# Patient Record
Sex: Female | Born: 1960 | Race: Black or African American | Hispanic: No | Marital: Single | Smoking: Never smoker
Health system: Southern US, Community
[De-identification: ages and names within clinical notes are randomized; demographics above are authoritative.]

## PROBLEM LIST (undated history)

## (undated) DIAGNOSIS — I10 Essential (primary) hypertension: Secondary | ICD-10-CM

## (undated) HISTORY — DX: Essential (primary) hypertension: I10

---

## 1997-08-18 ENCOUNTER — Emergency Department (HOSPITAL_COMMUNITY): Admission: EM | Admit: 1997-08-18 | Discharge: 1997-08-18 | Payer: Self-pay | Admitting: Emergency Medicine

## 1998-10-05 ENCOUNTER — Other Ambulatory Visit: Admission: RE | Admit: 1998-10-05 | Discharge: 1998-10-05 | Payer: Self-pay | Admitting: Obstetrics and Gynecology

## 2000-08-20 ENCOUNTER — Ambulatory Visit (HOSPITAL_BASED_OUTPATIENT_CLINIC_OR_DEPARTMENT_OTHER): Admission: RE | Admit: 2000-08-20 | Discharge: 2000-08-20 | Payer: Self-pay | Admitting: Surgery

## 2009-08-30 ENCOUNTER — Emergency Department (HOSPITAL_COMMUNITY): Admission: EM | Admit: 2009-08-30 | Discharge: 2009-08-31 | Payer: Self-pay | Admitting: Emergency Medicine

## 2010-06-14 NOTE — Op Note (Signed)
[place]. [facility]  Patient:    AMORITA, VANROSSUM                       MRN: [id number] Proc. Date: 08/20/00 Attending:  Abigail Miyamoto, M.D.                           Operative Report  PREOPERATIVE DIAGNOSIS:  Right flank mass.  POSTOPERATIVE DIAGNOSIS:  Right flank mass.  PROCEDURE:  Excision of right flank mass measuring 6 x 8 cm.  SURGEON:  Abigail Miyamoto, M.D.  ANESTHESIA:  1% lidocaine with monitored anesthesia care.  DESCRIPTION OF PROCEDURE:  The patient was brought to the operating room, identified as Rosanna Randy.  She was placed upon the operating table, and anesthesia was induced.  Next, the patient was placed in the left lateral decubitus position.  The skin overlying the large, palpable mass was anesthetized with 1% lidocaine.  The incision was carried down through the skin with a scalpel.  The flank fascia was then opened and the large mass consisting of a lipoma was identified.  It was completely dissected free circumferentially with the electrocautery.  The mass was removed in its entirety.  The wound was then thoroughly irrigated with normal saline.  The subcutaneous layer was then closed with interrupted 2-0 Vicryl, skin closed with a running 4-0 Vicryl suture, Steri-Strips, gauze, and tape were then applied.  The patient tolerated the procedure well.  All sponge, needle, and instrument counts were correct at the end of the procedure.  The patient was taken in stable condition from the operating room to the recovery room. DD:  08/20/00 TD:  08/20/00 Job: [id number]
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
## 2019-03-01 ENCOUNTER — Ambulatory Visit (HOSPITAL_COMMUNITY)
Admission: EM | Admit: 2019-03-01 | Discharge: 2019-03-01 | Disposition: A | Discharge status: Home / Self Care | Payer: Medicaid Other | Attending: Family Medicine | Admitting: Family Medicine

## 2019-03-01 ENCOUNTER — Other Ambulatory Visit: Payer: Self-pay

## 2019-03-01 ENCOUNTER — Encounter (HOSPITAL_COMMUNITY): Payer: Self-pay

## 2019-03-01 DIAGNOSIS — R030 Elevated blood-pressure reading, without diagnosis of hypertension: Secondary | ICD-10-CM | POA: Diagnosis not present

## 2019-03-01 DIAGNOSIS — K529 Noninfective gastroenteritis and colitis, unspecified: Secondary | ICD-10-CM | POA: Diagnosis present

## 2019-03-01 DIAGNOSIS — Z20822 Contact with and (suspected) exposure to covid-19: Secondary | ICD-10-CM | POA: Diagnosis present

## 2019-03-01 MED ORDER — ONDANSETRON 4 MG PO TBDP
4.0000 mg | ORAL_TABLET | Freq: Once | ORAL | Status: AC
  Administered 2019-03-01: 4 mg via ORAL

## 2019-03-01 MED ORDER — LOPERAMIDE HCL 2 MG PO CAPS: 2.0000 mg | ORAL_CAPSULE | Freq: Four times a day (QID) | ORAL | 0 refills | Status: DC | PRN

## 2019-03-01 MED ORDER — ONDANSETRON HCL 4 MG PO TABS: 4.0000 mg | ORAL_TABLET | Freq: Three times a day (TID) | ORAL | 0 refills | Status: AC | PRN

## 2019-03-01 MED ORDER — ONDANSETRON 4 MG PO TBDP
ORAL_TABLET | ORAL | Status: AC
  Filled 2019-03-01: qty 1

## 2019-03-01 NOTE — Discharge Instructions (Addendum)
Take the zofran every 8 hours  Begin the imodium if you continue to have loose stools tonight  Drink plenty of water and include gatorade.  Eat small bland meals of hearty foods  Establish with a primary care provider to have your blood pressure rechecked in 2 weeks   Go directly to the Emergency Department or call 911 if you have severe chest pain, shortness of breath, severe diarrhea or feel as though you might pass out.   If your Covid-19 test is positive, you will receive a phone call from [facility] regarding your results. Negative test results are not called. Both positive and negative results area always visible on MyChart. If you do not have a MyChart account, sign up instructions are in your discharge papers.   Persons who are directed to care for themselves at home may discontinue isolation under the following conditions:   At least 10 days have passed since symptom onset and  At least 24 hours have passed without running a fever (this means without the use of fever-reducing medications) and  Other symptoms have improved.  Persons infected with COVID-19 who never develop symptoms may discontinue isolation and other precautions 10 days after the date of their first positive COVID-19 test.

## 2019-03-01 NOTE — ED Provider Notes (Signed)
Kylie Macdonald    CSN: [id number] Arrival date & time: 03/01/19  [place]      History   Chief Complaint Chief Complaint  Patient presents with  . Nausea  . Emesis    HPI Kylie Macdonald is a 59 y.o. female.   Patient reports to urgent care today for 1 day history of nausea, vomiting and diarrhea. She reports 4 episodes of vomiting after consuming food yesterday. She states she was able to keep some fluids down. The vomiting only occurred after eating and she denies dry heaving. She denies vomiting today but continues to endorse some nausea. Denies blood in vomit. She reports around 4 episodes of liquid brown diarrhea yesterday as well. She has had 3 episodes today. She denies blood in stool. Denies fever, chills, cough, shortness of breath, congestion, headache, sore throat. Denies painful urination, frequency and urgency.  She reports her daughter had similar symptoms last week. She has not taken medications for her symptoms and believes she is generally doing better.      History reviewed. No pertinent past medical history.  There are no problems to display for this patient.   History reviewed. No pertinent surgical history.  OB History   No obstetric history on file.      Home Medications    Prior to Admission medications   Medication Sig Start Date End Date Taking? Authorizing Provider  loperamide (IMODIUM) 2 MG capsule Take 1 capsule (2 mg total) by mouth 4 (four) times daily as needed for diarrhea or loose stools. 03/01/19   Muaad Boehning, Marguerita Beards, PA-C  ondansetron (ZOFRAN) 4 MG tablet Take 1 tablet (4 mg total) by mouth every 8 (eight) hours as needed for up to 3 days for nausea or vomiting. 03/01/19 03/04/19  Shanda Cadotte, Marguerita Beards, PA-C    Family History Family History  Family history unknown: Yes    Social History Social History   Tobacco Use  . Smoking status: Never Smoker  . Smokeless tobacco: Never Used  Substance Use Topics  . Alcohol use: Never  . Drug use:  Never     Allergies   Patient has no known allergies.   Review of Systems Review of Systems  Constitutional: Negative for chills, fatigue and fever.  HENT: Negative for congestion, ear pain, postnasal drip, rhinorrhea, sinus pressure, sinus pain, sneezing and sore throat.   Eyes: Negative for pain and visual disturbance.  Respiratory: Negative for cough and shortness of breath.   Cardiovascular: Negative for chest pain and palpitations.  Gastrointestinal: Positive for diarrhea, nausea and vomiting. Negative for abdominal pain.  Genitourinary: Negative for dysuria, frequency, hematuria and urgency.  Musculoskeletal: Negative for arthralgias and back pain.  Skin: Negative for color change and rash.  Neurological: Negative for seizures, syncope and headaches.  All other systems reviewed and are negative.    Physical Exam Triage Vital Signs ED Triage Vitals  Enc Vitals Group     BP 03/01/19 1846 (!) 156/103     Pulse Rate 03/01/19 1846 93     Resp 03/01/19 1846 18     Temp 03/01/19 1846 98.8 F (37.1 C)     Temp Source 03/01/19 1846 Oral     SpO2 03/01/19 1846 100 %     Weight --      Height --      Head Circumference --      Peak Flow --      Pain Score 03/01/19 1843 0     Pain Loc --  Pain Edu? --      Excl. in GC? --    No data found.  Updated Vital Signs BP (!) 156/103 (BP Location: Left Arm)   Pulse 93   Temp 98.8 F (37.1 C) (Oral)   Resp 18   SpO2 100%   Visual Acuity Right Eye Distance:   Left Eye Distance:   Bilateral Distance:    Right Eye Near:   Left Eye Near:    Bilateral Near:     Physical Exam Vitals and nursing note reviewed.  Constitutional:      General: She is not in acute distress.    Appearance: She is well-developed. She is not ill-appearing.  HENT:     Head: Normocephalic and atraumatic.     Nose: Nose normal. No congestion.     Mouth/Throat:     Mouth: Mucous membranes are moist.     Pharynx: Oropharynx is clear.  Eyes:      General: No scleral icterus.    Conjunctiva/sclera: Conjunctivae normal.     Pupils: Pupils are equal, round, and reactive to light.  Cardiovascular:     Rate and Rhythm: Normal rate and regular rhythm.     Heart sounds: No murmur.  Pulmonary:     Effort: Pulmonary effort is normal. No respiratory distress.     Breath sounds: Normal breath sounds. No wheezing, rhonchi or rales.  Abdominal:     General: Bowel sounds are normal.     Palpations: Abdomen is soft.     Tenderness: There is no abdominal tenderness. There is no right CVA tenderness or left CVA tenderness.  Musculoskeletal:     Cervical back: Neck supple.     Right lower leg: No edema.     Left lower leg: No edema.  Skin:    General: Skin is warm and dry.     Coloration: Skin is not jaundiced.     Findings: No rash.  Neurological:     General: No focal deficit present.     Mental Status: She is alert and oriented to person, place, and time.  Psychiatric:        Mood and Affect: Mood normal.        Behavior: Behavior normal.        Thought Content: Thought content normal.        Judgment: Judgment normal.      UC Treatments / Results  Labs (all labs ordered are listed, but only abnormal results are displayed) Labs Reviewed  NOVEL CORONAVIRUS, NAA (HOSP ORDER, SEND-OUT TO REF LAB; TAT 18-24 HRS)    EKG   Radiology No results found.  Procedures Procedures (including critical care time)  Medications Ordered in UC Medications  ondansetron (ZOFRAN-ODT) disintegrating tablet 4 mg (4 mg Oral Given 03/01/19 1856)    Initial Impression / Assessment and Plan / UC Course  I have reviewed the triage vital signs and the nursing notes.  Pertinent labs & imaging results that were available during my care of the patient were reviewed by me and considered in my medical decision making (see chart for details).     #Gastroenteritis - believe to be viral. Improving symptoms. Zofran in clinic. Abdominal exam benign.   - COVID PCR sent - Encouraged PO intake, zofran sent for nausea - Discussed follow up for BP, PCP information discussed - ED and return precautions discussed  Final Clinical Impressions(s) / UC Diagnoses   Final diagnoses:  Gastroenteritis  Encounter for laboratory testing for COVID-19 virus  Elevated blood pressure reading without diagnosis of hypertension     Discharge Instructions     Take the zofran every 8 hours  Begin the imodium if you continue to have loose stools tonight  Drink plenty of water and include gatorade.  Eat small bland meals of hearty foods  Establish with a primary care provider to have your blood pressure rechecked in 2 weeks   Go directly to the Emergency Department or call 911 if you have severe chest pain, shortness of breath, severe diarrhea or feel as though you might pass out.   If your Covid-19 test is positive, you will receive a phone call from [facility] regarding your results. Negative test results are not called. Both positive and negative results area always visible on MyChart. If you do not have a MyChart account, sign up instructions are in your discharge papers.   Persons who are directed to care for themselves at home may discontinue isolation under the following conditions:  . At least 10 days have passed since symptom onset and . At least 24 hours have passed without running a fever (this means without the use of fever-reducing medications) and . Other symptoms have improved.  Persons infected with COVID-19 who never develop symptoms may discontinue isolation and other precautions 10 days after the date of their first positive COVID-19 test.      ED Prescriptions    Medication Sig Dispense Auth. Provider   ondansetron (ZOFRAN) 4 MG tablet Take 1 tablet (4 mg total) by mouth every 8 (eight) hours as needed for up to 3 days for nausea or vomiting. 9 tablet Phillippe Orlick, Veryl Speak, PA-C   loperamide (IMODIUM) 2 MG capsule Take 1 capsule  (2 mg total) by mouth 4 (four) times daily as needed for diarrhea or loose stools. 12 capsule Adylin Hankey, Veryl Speak, PA-C     PDMP not reviewed this encounter.   Hermelinda Medicus, PA-C 03/01/19 2259

## 2019-03-01 NOTE — ED Triage Notes (Addendum)
Pt c/o n/v/d since last night, states sx improved this evening.  Last emesis this afternoon. States her daughter "had a stomach bug last week."   Denies abd pain, dysuria sx, SOB, HA, fever, chills, sore throat, HA.

## 2019-03-03 LAB — NOVEL CORONAVIRUS, NAA (HOSP ORDER, SEND-OUT TO REF LAB; TAT 18-24 HRS): SARS-CoV-2, NAA: NOT DETECTED

## 2019-06-16 DIAGNOSIS — Z23 Encounter for immunization: Secondary | ICD-10-CM | POA: Diagnosis not present

## 2019-06-30 DIAGNOSIS — E669 Obesity, unspecified: Secondary | ICD-10-CM | POA: Diagnosis not present

## 2019-06-30 DIAGNOSIS — I10 Essential (primary) hypertension: Secondary | ICD-10-CM | POA: Diagnosis not present

## 2019-07-07 DIAGNOSIS — Z23 Encounter for immunization: Secondary | ICD-10-CM | POA: Diagnosis not present

## 2020-01-10 DIAGNOSIS — E785 Hyperlipidemia, unspecified: Secondary | ICD-10-CM | POA: Diagnosis not present

## 2020-01-10 DIAGNOSIS — Z7189 Other specified counseling: Secondary | ICD-10-CM | POA: Diagnosis not present

## 2020-01-10 DIAGNOSIS — I10 Essential (primary) hypertension: Secondary | ICD-10-CM | POA: Diagnosis not present

## 2020-01-17 DIAGNOSIS — E6609 Other obesity due to excess calories: Secondary | ICD-10-CM | POA: Diagnosis not present

## 2020-01-17 DIAGNOSIS — I10 Essential (primary) hypertension: Secondary | ICD-10-CM | POA: Diagnosis not present

## 2020-01-17 DIAGNOSIS — Z7189 Other specified counseling: Secondary | ICD-10-CM | POA: Diagnosis not present

## 2020-02-07 DIAGNOSIS — I10 Essential (primary) hypertension: Secondary | ICD-10-CM | POA: Diagnosis not present

## 2020-02-07 DIAGNOSIS — Z7189 Other specified counseling: Secondary | ICD-10-CM | POA: Diagnosis not present

## 2020-02-07 DIAGNOSIS — R000 Tachycardia, unspecified: Secondary | ICD-10-CM | POA: Diagnosis not present

## 2020-03-20 ENCOUNTER — Ambulatory Visit: Payer: Self-pay | Admitting: Cardiology

## 2020-04-03 ENCOUNTER — Ambulatory Visit: Payer: Self-pay | Admitting: Cardiology

## 2020-05-01 ENCOUNTER — Encounter: Payer: Self-pay | Admitting: Cardiology

## 2020-05-01 ENCOUNTER — Other Ambulatory Visit: Payer: Self-pay

## 2020-05-01 ENCOUNTER — Ambulatory Visit: Payer: Medicaid Other | Admitting: Cardiology

## 2020-05-01 ENCOUNTER — Inpatient Hospital Stay: Payer: Self-pay

## 2020-05-01 VITALS — BP 126/84 | HR 86 | Temp 98.1°F | Resp 16 | Ht 63.0 in | Wt 203.0 lb

## 2020-05-01 DIAGNOSIS — R002 Palpitations: Secondary | ICD-10-CM

## 2020-05-01 DIAGNOSIS — R000 Tachycardia, unspecified: Secondary | ICD-10-CM | POA: Diagnosis not present

## 2020-05-01 DIAGNOSIS — E6609 Other obesity due to excess calories: Secondary | ICD-10-CM | POA: Diagnosis not present

## 2020-05-01 DIAGNOSIS — I10 Essential (primary) hypertension: Secondary | ICD-10-CM | POA: Diagnosis not present

## 2020-05-01 DIAGNOSIS — Z6835 Body mass index (BMI) 35.0-35.9, adult: Secondary | ICD-10-CM

## 2020-05-01 DIAGNOSIS — E66812 Obesity, class 2: Secondary | ICD-10-CM

## 2020-05-01 NOTE — Progress Notes (Signed)
Date:  05/01/2020   ID:  Kylie Macdonald, DOB 25-Feb-1960, MRN [id number]  PCP:  Patient, No Pcp Per (Inactive)  Cardiologist:  Rex Kras, DO, [facility] (established care 05/01/2020)  REASON FOR CONSULT: Tachycardia, abnormal EKG  REQUESTING PHYSICIAN:  Jordan Hawks, [address] [place],  [place] [address]  Chief Complaint  Patient presents with  . Tachycardia  . Abnormal ECG  . New Patient (Initial Visit)    HPI  Kylie Macdonald is a 60 y.o. female who presents to the office with a chief complaint of " tachycardia and palpitations." Patient's past medical history and cardiovascular risk factors include: Hypertension, obesity due to excess calories.  She is referred to the office at the request of Jordan Hawks, NP for evaluation of tachycardia, abnormal EKG.  Patient states that she recently went to her primary care provider and was referred to cardiology due to palpitations/tachycardia.  Upon reviewing PCP notes patient had an KG at the office which noted borderline prolongation of the QT interval as well.  Palpitations: Patient states that intermittently she has palpitations that occur for a few seconds and are usually self-limited.  Patient thinks it is most likely situational due to underlying stress and anxiety.  No improving or worsening factors.  Patient denies the use of illicit drugs, weight loss supplements, stimulants, energy drinks, herbal supplements.  She drinks coffee twice a week.  No significant soda consumption.  No family history of premature coronary disease or sudden cardiac death.   FUNCTIONAL STATUS: No structured exercise program or daily routine.   ALLERGIES: No Known Allergies  MEDICATION LIST PRIOR TO VISIT: Current Meds  Medication Sig  . amLODipine (NORVASC) 10 MG tablet Take 1 tablet by mouth daily.  . hydrochlorothiazide (HYDRODIURIL) 25 MG tablet Take 1 tablet by mouth daily.  . metoprolol succinate (TOPROL-XL) 25 MG 24 hr  tablet Take 1 tablet by mouth daily.  . valsartan (DIOVAN) 160 MG tablet Take 160 mg by mouth daily.     PAST MEDICAL HISTORY: Past Medical History:  Diagnosis Date  . Hypertension     PAST SURGICAL HISTORY: History reviewed. No pertinent surgical history.  FAMILY HISTORY: The patient Family history is unknown by patient.  SOCIAL HISTORY:  The patient  reports that she has never smoked. She has never used smokeless tobacco. She reports that she does not drink alcohol and does not use drugs.  REVIEW OF SYSTEMS: Review of Systems  Constitutional: Negative for chills and fever.  HENT: Negative for hoarse voice and nosebleeds.   Eyes: Negative for discharge, double vision and pain.  Cardiovascular: Positive for palpitations. Negative for chest pain, claudication, dyspnea on exertion, leg swelling, near-syncope, orthopnea, paroxysmal nocturnal dyspnea and syncope.  Respiratory: Negative for hemoptysis and shortness of breath.   Musculoskeletal: Negative for muscle cramps and myalgias.  Gastrointestinal: Negative for abdominal pain, constipation, diarrhea, hematemesis, hematochezia, melena, nausea and vomiting.  Neurological: Negative for dizziness and light-headedness.    PHYSICAL EXAM: Vitals with BMI 05/01/2020 03/01/2019  Height 5' 3"  -  Weight 203 lbs -  BMI 41.66 -  Systolic 063 016  Diastolic 84 010  Pulse 86 93   CONSTITUTIONAL: Well-developed and well-nourished. No acute distress.  SKIN: Skin is warm and dry. No rash noted. No cyanosis. No pallor. No jaundice HEAD: Normocephalic and atraumatic.  EYES: No scleral icterus MOUTH/THROAT: Moist oral membranes.  NECK: No JVD present. No thyromegaly noted. No carotid bruits  LYMPHATIC: No visible cervical adenopathy.  CHEST Normal respiratory effort. No intercostal retractions  LUNGS: Clear to auscultation bilaterally.  No stridor. No wheezes. No rales.  CARDIOVASCULAR: Regular rate and rhythm, positive S1-S2, no murmurs rubs  or gallops appreciated. ABDOMINAL: Soft, obese, nontender, nondistended, positive bowel sounds in all 4 quadrants no apparent ascites.  EXTREMITIES: No peripheral edema, faint posterior tibial pulses bilaterally.  Strong dorsalis pedis pulses bilaterally, poor nail hygiene. HEMATOLOGIC: No significant bruising NEUROLOGIC: Oriented to person, place, and time. Nonfocal. Normal muscle tone.  PSYCHIATRIC: Normal mood and affect. Normal behavior. Cooperative  CARDIAC DATABASE: EKG: 05/01/2020: Normal sinus rhythm, 78 bpm, normal axis, QT 433 ms, without underlying ischemia or injury pattern.  Echocardiogram: No results found for this or any previous visit from the past 1095 days.   Stress Testing: No results found for this or any previous visit from the past 1095 days.   Heart Catheterization: None   LABORATORY DATA: No flowsheet data found.  No flowsheet data found.  Lipid Panel  No results found for: CHOL, TRIG, HDL, CHOLHDL, VLDL, LDLCALC, LDLDIRECT, LABVLDL  No components found for: NTPROBNP No results for input(s): PROBNP in the last 8760 hours. No results for input(s): TSH in the last 8760 hours.  BMP No results for input(s): NA, K, CL, CO2, GLUCOSE, BUN, CREATININE, CALCIUM, GFRNONAA, GFRAA in the last 8760 hours.  HEMOGLOBIN A1C No results found for: HGBA1C, MPG  External Labs: Collected: 01/10/2020 provided by PCP. Creatinine 0.63 mg/dL. eGFR: 115 mL/min per 1.73 m Potassium 4.2  Lipid profile: Total cholesterol 178, triglycerides 83, HDL 54, LDL 107, non-HDL 124 AST 14, ALT 13, alkaline phosphatase 103   IMPRESSION:    ICD-10-CM   1. Tachycardia  R00.0 EKG 16-BWGY    Basic metabolic panel    Magnesium    TSH    LONG TERM MONITOR (3-14 DAYS)  2. Palpitations  K59.9 Basic metabolic panel    Magnesium    TSH    LONG TERM MONITOR (3-14 DAYS)  3. Benign hypertension  I10 PCV ECHOCARDIOGRAM COMPLETE  4. Class 2 obesity due to excess calories without serious  comorbidity with body mass index (BMI) of 35.0 to 35.9 in adult  E66.09    Z68.35      RECOMMENDATIONS: Kylie Macdonald is a 60 y.o. female whose past medical history and cardiac risk factors include: Hypertension, obesity due to excess calories.  Palpitations:  No active symptoms.  Based on the patient's symptoms I suspect that she has underlying symptomatic PACs and PVCs at times which she is experiencing.  14-day extended Holter monitor to evaluate for underlying dysrhythmias.  Check a BMP, magnesium level, and TSH  No known reversible causes identified at this time.  Benign essential hypertension:  Patient is currently on 4 medications for the management of her high blood pressure.  Currently managed by primary care provider.  Office blood pressure is very well controlled.  Educated on the importance of low-salt diet and increasing physical activity to 30 minutes a day 5 days a week as tolerated  May also consider sleep apnea evaluation given the clinical situation.  Will defer to primary team with regards to providing the patient with a referral.  Echocardiogram will be ordered to evaluate for structural heart disease and left ventricular systolic function.  Obesity, due to excess calories: . Body mass index is 35.96 kg/m. . I reviewed with the patient the importance of diet, regular physical activity/exercise, weight loss.   . Patient is educated on increasing physical activity gradually  as tolerated.  With the goal of moderate intensity exercise for 30 minutes a day 5 days a week.  FINAL MEDICATION LIST END OF ENCOUNTER: No orders of the defined types were placed in this encounter.   Medications Discontinued During This Encounter  Medication Reason  . loperamide (IMODIUM) 2 MG capsule Error     Current Outpatient Medications:  .  amLODipine (NORVASC) 10 MG tablet, Take 1 tablet by mouth daily., Disp: , Rfl:  .  hydrochlorothiazide (HYDRODIURIL) 25 MG tablet,  Take 1 tablet by mouth daily., Disp: , Rfl:  .  metoprolol succinate (TOPROL-XL) 25 MG 24 hr tablet, Take 1 tablet by mouth daily., Disp: , Rfl:  .  valsartan (DIOVAN) 160 MG tablet, Take 160 mg by mouth daily., Disp: , Rfl:   Orders Placed This Encounter  Procedures  . Basic metabolic panel  . Magnesium  . TSH  . LONG TERM MONITOR (3-14 DAYS)  . EKG 12-Lead  . PCV ECHOCARDIOGRAM COMPLETE    There are no Patient Instructions on file for this visit.   --Continue cardiac medications as reconciled in final medication list. --Return in about 7 weeks (around 06/19/2020) for Follow up, Palpitations, Review test results. Or sooner if needed. --Continue follow-up with your primary care physician regarding the management of your other chronic comorbid conditions.  Patient's questions and concerns were addressed to her satisfaction. She voices understanding of the instructions provided during this encounter.   This note was created using a voice recognition software as a result there may be grammatical errors inadvertently enclosed that do not reflect the nature of this encounter. Every attempt is made to correct such errors.  Rex Kras, [place], [facility]  Pager: [phone] Office: [phone]

## 2020-05-21 ENCOUNTER — Other Ambulatory Visit: Payer: Medicaid Other

## 2020-05-30 ENCOUNTER — Other Ambulatory Visit: Payer: Self-pay

## 2020-05-30 ENCOUNTER — Ambulatory Visit: Payer: Medicaid Other

## 2020-05-30 DIAGNOSIS — I10 Essential (primary) hypertension: Secondary | ICD-10-CM

## 2020-05-31 ENCOUNTER — Other Ambulatory Visit: Payer: Medicaid Other

## 2020-06-04 NOTE — Progress Notes (Signed)
Spoke to patient she voiced understanding

## 2020-06-25 NOTE — Progress Notes (Signed)
Ordered 14 day monitor but there were 2 monitors during this duration.   Second monitor interpretation as noted below:  13 day extended Holter monitor: Dominant rhythm normal sinus.  Heart rate 50-169 bpm.  Avg HR 85 bpm. No atrial fibrillation, high grade AV block, pauses (3 seconds or longer). Total ventricular ectopic burden <1%. One episode of NSVT, 5 beats in duration, HR between (132-169bpm).  Total supraventricular ectopic burden <1%. Patient triggered events: 0.   MA Team: We will review the results at the upcoming office visit.

## 2020-06-26 ENCOUNTER — Ambulatory Visit: Payer: Medicaid Other | Admitting: Cardiology

## 2020-06-26 NOTE — Progress Notes (Signed)
Called and spoke with patient regarding her heart monitor results. Patient is aware provider will review results at next office and appointment date and time was confirmed.

## 2020-07-03 ENCOUNTER — Ambulatory Visit: Payer: Medicaid Other | Admitting: Cardiology

## 2020-07-08 DIAGNOSIS — Z20822 Contact with and (suspected) exposure to covid-19: Secondary | ICD-10-CM | POA: Diagnosis not present

## 2020-07-16 ENCOUNTER — Ambulatory Visit: Payer: Medicaid Other | Admitting: Cardiology

## 2020-08-16 ENCOUNTER — Ambulatory Visit: Payer: Medicaid Other | Admitting: Cardiology

## 2020-08-23 ENCOUNTER — Ambulatory Visit: Payer: Medicaid Other | Admitting: Cardiology

## 2020-08-29 DIAGNOSIS — R002 Palpitations: Secondary | ICD-10-CM | POA: Diagnosis not present

## 2020-08-29 DIAGNOSIS — R000 Tachycardia, unspecified: Secondary | ICD-10-CM | POA: Diagnosis not present

## 2020-08-30 LAB — BASIC METABOLIC PANEL
BUN/Creatinine Ratio: 21 (ref 9–23)
BUN: 15 mg/dL (ref 6–24)
CO2: 23 mmol/L (ref 20–29)
Calcium: 10.3 mg/dL — ABNORMAL HIGH (ref 8.7–10.2)
Chloride: 103 mmol/L (ref 96–106)
Creatinine, Ser: 0.71 mg/dL (ref 0.57–1.00)
Glucose: 100 mg/dL — ABNORMAL HIGH (ref 65–99)
Potassium: 3.8 mmol/L (ref 3.5–5.2)
Sodium: 141 mmol/L (ref 134–144)
eGFR: 98 mL/min/{1.73_m2} (ref >59)

## 2020-08-30 LAB — MAGNESIUM: Magnesium: 2.1 mg/dL (ref 1.6–2.3)

## 2020-08-30 LAB — TSH: TSH: 0.933 u[IU]/mL (ref 0.450–4.500)

## 2020-09-07 NOTE — Progress Notes (Signed)
Called and spoke with patient regarding Her lab results. Patient confirmed she will be here at her next OV.

## 2020-09-20 ENCOUNTER — Ambulatory Visit: Payer: Medicaid Other | Admitting: Cardiology

## 2020-09-20 ENCOUNTER — Encounter: Payer: Self-pay | Admitting: Cardiology

## 2020-09-20 ENCOUNTER — Other Ambulatory Visit: Payer: Self-pay

## 2020-09-20 VITALS — BP 133/85 | HR 82 | Temp 97.9°F | Resp 16 | Ht 63.0 in | Wt 201.0 lb

## 2020-09-20 DIAGNOSIS — E6609 Other obesity due to excess calories: Secondary | ICD-10-CM | POA: Diagnosis not present

## 2020-09-20 DIAGNOSIS — R002 Palpitations: Secondary | ICD-10-CM | POA: Diagnosis not present

## 2020-09-20 DIAGNOSIS — Z6835 Body mass index (BMI) 35.0-35.9, adult: Secondary | ICD-10-CM | POA: Diagnosis not present

## 2020-09-20 DIAGNOSIS — I10 Essential (primary) hypertension: Secondary | ICD-10-CM

## 2020-09-20 DIAGNOSIS — R000 Tachycardia, unspecified: Secondary | ICD-10-CM

## 2020-09-20 NOTE — Progress Notes (Signed)
Date:  09/20/2020   ID:  Kylie Macdonald, DOB 07-27-1960, MRN [id number]  PCP:  Patient, No Pcp Per (Inactive)  Cardiologist:  Rex Kras, DO, [facility] (established care 05/01/2020)  Date: 09/20/20 Last Office Visit: 05/01/2020   Chief Complaint  Patient presents with   Palpitations   Results   Follow-up    HPI  Kylie Macdonald is a 60 y.o. female who presents to the office with a chief complaint of " tachycardia and palpitations." Patient's past medical history and cardiovascular risk factors include: Hypertension, obesity due to excess calories.  She is referred to the office at the request of No ref. provider found for evaluation of tachycardia, abnormal EKG.  During endoscopic discretion during initial consultation she was complaining of symptoms of palpitations that were occurring on a regular basis, lasting for few seconds and usually self-limited.  No identifiable reversible causes known.  Patient underwent an extended Holter monitor results which were reviewed with her in great detail at today's office visit and noted below for further reference.  Lab work from 08/29/2020 independently reviewed.  TSH within normal limits and electrolytes are also within normal limits.  Since last office visit patient states that the palpitations have resolved.  No hospitalizations or urgent care visits since last office encounter.  Office blood pressures are within acceptable range.  Medications reconciled.   FUNCTIONAL STATUS: No structured exercise program or daily routine.   ALLERGIES: No Known Allergies  MEDICATION LIST PRIOR TO VISIT: No outpatient medications have been marked as taking for the 09/20/20 encounter (Office Visit) with Rex Kras, DO.     PAST MEDICAL HISTORY: Past Medical History:  Diagnosis Date   Hypertension     PAST SURGICAL HISTORY: History reviewed. No pertinent surgical history.  FAMILY HISTORY: The patient Family history is unknown by  patient.  SOCIAL HISTORY:  The patient  reports that she has never smoked. She has never used smokeless tobacco. She reports that she does not drink alcohol and does not use drugs.  REVIEW OF SYSTEMS: Review of Systems  Constitutional: Negative for chills and fever.  HENT:  Negative for hoarse voice and nosebleeds.   Eyes:  Negative for discharge, double vision and pain.  Cardiovascular:  Negative for chest pain, claudication, dyspnea on exertion, leg swelling, near-syncope, orthopnea, palpitations, paroxysmal nocturnal dyspnea and syncope.  Respiratory:  Negative for hemoptysis and shortness of breath.   Musculoskeletal:  Negative for muscle cramps and myalgias.  Gastrointestinal:  Negative for abdominal pain, constipation, diarrhea, hematemesis, hematochezia, melena, nausea and vomiting.  Neurological:  Negative for dizziness and light-headedness.   PHYSICAL EXAM: Vitals with BMI 09/20/2020 05/01/2020 03/01/2019  Height 5' 3"  5' 3"  -  Weight 201 lbs 203 lbs -  BMI 20.23 34.35 -  Systolic 686 168 372  Diastolic 85 84 902  Pulse 82 86 93   CONSTITUTIONAL: Well-developed and well-nourished. No acute distress.  SKIN: Skin is warm and dry. No rash noted. No cyanosis. No pallor. No jaundice HEAD: Normocephalic and atraumatic.  EYES: No scleral icterus MOUTH/THROAT: Moist oral membranes.  NECK: No JVD present. No thyromegaly noted. No carotid bruits  LYMPHATIC: No visible cervical adenopathy.  CHEST Normal respiratory effort. No intercostal retractions  LUNGS: Clear to auscultation bilaterally.  No stridor. No wheezes. No rales.  CARDIOVASCULAR: Regular rate and rhythm, positive S1-S2, no murmurs rubs or gallops appreciated. ABDOMINAL: Soft, obese, nontender, nondistended, positive bowel sounds in all 4 quadrants no apparent ascites.  EXTREMITIES: No peripheral edema, faint  posterior tibial pulses bilaterally.  Strong dorsalis pedis pulses bilaterally, poor nail hygiene. HEMATOLOGIC: No  significant bruising NEUROLOGIC: Oriented to person, place, and time. Nonfocal. Normal muscle tone.  PSYCHIATRIC: Normal mood and affect. Normal behavior. Cooperative No significant change in physical examination since last office encounter  CARDIAC DATABASE: EKG: 05/01/2020: Normal sinus rhythm, 78 bpm, normal axis, QT 433 ms, without underlying ischemia or injury pattern.  Echocardiogram: 05/30/2020:  Left ventricle cavity is normal in size and wall thickness. Normal global wall motion. Normal LV systolic function with EF 64%. Doppler evidence of grade II (pseudonormal) diastolic dysfunction, elevated LAP.  Left atrial cavity is mildly dilated.  Mild (Grade I) mitral regurgitation.  Mild tricuspid regurgitation.  No evidence of pulmonary hypertension.   Stress Testing: No results found for this or any previous visit from the past 1095 days.   Heart Catheterization: None   1 day extended Holter monitor: Dominant rhythm normal sinus rhythm. Heart rate 50-140 bpm.  Avg HR 85 bpm. No atrial fibrillation, supraventricular tachycardia, ventricular tachycardia, high grade AV block, pauses (3 seconds or longer). Total ventricular ectopic burden <1%. Total supraventricular ectopic burden <1%. Patient triggered events: 0.    Second monitor interpretation as noted below:  13 day extended Holter monitor: Dominant rhythm normal sinus.  Heart rate 50-169 bpm.  Avg HR 85 bpm. No atrial fibrillation, high grade AV block, pauses (3 seconds or longer). Total ventricular ectopic burden <1%. One episode of NSVT, 5 beats in duration, HR between (132-169bpm).  Total supraventricular ectopic burden <1%. Patient triggered events: 0.   LABORATORY DATA: No flowsheet data found.  CMP Latest Ref Rng & Units 08/29/2020  Glucose 65 - 99 mg/dL 100(H)  BUN 6 - 24 mg/dL 15  Creatinine 0.57 - 1.00 mg/dL 0.71  Sodium 134 - 144 mmol/L 141  Potassium 3.5 - 5.2 mmol/L 3.8  Chloride 96 - 106 mmol/L 103  CO2  20 - 29 mmol/L 23  Calcium 8.7 - 10.2 mg/dL 10.3(H)    Lipid Panel  No results found for: CHOL, TRIG, HDL, CHOLHDL, VLDL, LDLCALC, LDLDIRECT, LABVLDL  No components found for: NTPROBNP No results for input(s): PROBNP in the last 8760 hours. Recent Labs    08/29/20 1317  TSH 0.933    BMP Recent Labs    08/29/20 1317  NA 141  K 3.8  CL 103  CO2 23  GLUCOSE 100*  BUN 15  CREATININE 0.71  CALCIUM 10.3*    HEMOGLOBIN A1C No results found for: HGBA1C, MPG  External Labs: Collected: 01/10/2020 provided by PCP. Creatinine 0.63 mg/dL. eGFR: 115 mL/min per 1.73 m Potassium 4.2  Lipid profile: Total cholesterol 178, triglycerides 83, HDL 54, LDL 107, non-HDL 124 AST 14, ALT 13, alkaline phosphatase 103  IMPRESSION:    ICD-10-CM   1. Palpitations  R00.2     2. Benign hypertension  I10     3. Class 2 obesity due to excess calories without serious comorbidity with body mass index (BMI) of 35.0 to 35.9 in adult  E66.09    Z68.35        RECOMMENDATIONS: Kylie Macdonald is a 60 y.o. female whose past medical history and cardiac risk factors include: Hypertension, obesity due to excess calories.  Palpitations: Resolved.   14-day extended Holter monitor results reviewed with her at today's office visit and noted above for further reference.   Labs 08/29/2020 independently reviewed.   Monitor for now.   No additional cardiovascular testing needed at this time.  Benign essential hypertension: Office blood pressures are within acceptable range.   Recent echocardiogram results reviewed.  Patient is noted to have grade 2 diastolic impairment with elevated left atrial pressure.  Discussed if she is been experiencing shortness of breath with effort related activities or heart failure symptoms.  Patient states that she is remains relatively stable.  For now we will continue current medical therapy otherwise may consider further titration of diuretic therapy. Medications  reconciled.   Educated on importance of increasing physical activity with a goal of 30 minutes a day 5 days a week. Low-salt diet recommended   Obesity, due to excess calories: Body mass index is 35.61 kg/m. I reviewed with the patient the importance of diet, regular physical activity/exercise, weight loss.   Patient is educated on increasing physical activity gradually as tolerated.  With the goal of moderate intensity exercise for 30 minutes a day 5 days a week.  FINAL MEDICATION LIST END OF ENCOUNTER: No orders of the defined types were placed in this encounter.   There are no discontinued medications.    Current Outpatient Medications:    amLODipine (NORVASC) 10 MG tablet, Take 1 tablet by mouth daily., Disp: , Rfl:    hydrochlorothiazide (HYDRODIURIL) 25 MG tablet, Take 1 tablet by mouth daily., Disp: , Rfl:    metoprolol succinate (TOPROL-XL) 25 MG 24 hr tablet, Take 1 tablet by mouth daily., Disp: , Rfl:    valsartan (DIOVAN) 160 MG tablet, Take 160 mg by mouth daily., Disp: , Rfl:   No orders of the defined types were placed in this encounter.   There are no Patient Instructions on file for this visit.   --Continue cardiac medications as reconciled in final medication list. --Return in about 1 year (around 09/20/2021) for Follow up, Palpitations. Or sooner if needed. --Continue follow-up with your primary care physician regarding the management of your other chronic comorbid conditions.  Patient's questions and concerns were addressed to her satisfaction. She voices understanding of the instructions provided during this encounter.   This note was created using a voice recognition software as a result there may be grammatical errors inadvertently enclosed that do not reflect the nature of this encounter. Every attempt is made to correct such errors.  Total time spent: 21 minutes.  Rex Kras, [place], [facility]  Pager: [phone] Office: [phone]

## 2020-11-15 DIAGNOSIS — R7309 Other abnormal glucose: Secondary | ICD-10-CM | POA: Diagnosis not present

## 2020-11-15 DIAGNOSIS — E785 Hyperlipidemia, unspecified: Secondary | ICD-10-CM | POA: Diagnosis not present

## 2020-11-15 DIAGNOSIS — E6609 Other obesity due to excess calories: Secondary | ICD-10-CM | POA: Diagnosis not present

## 2020-11-15 DIAGNOSIS — I10 Essential (primary) hypertension: Secondary | ICD-10-CM | POA: Diagnosis not present

## 2020-12-17 DIAGNOSIS — E6609 Other obesity due to excess calories: Secondary | ICD-10-CM | POA: Diagnosis not present

## 2020-12-17 DIAGNOSIS — Z6841 Body Mass Index (BMI) 40.0 and over, adult: Secondary | ICD-10-CM | POA: Diagnosis not present

## 2020-12-17 DIAGNOSIS — R7309 Other abnormal glucose: Secondary | ICD-10-CM | POA: Diagnosis not present

## 2020-12-17 DIAGNOSIS — R000 Tachycardia, unspecified: Secondary | ICD-10-CM | POA: Diagnosis not present

## 2020-12-17 DIAGNOSIS — E785 Hyperlipidemia, unspecified: Secondary | ICD-10-CM | POA: Diagnosis not present

## 2020-12-17 DIAGNOSIS — I10 Essential (primary) hypertension: Secondary | ICD-10-CM | POA: Diagnosis not present

## 2021-01-16 DIAGNOSIS — R7309 Other abnormal glucose: Secondary | ICD-10-CM | POA: Diagnosis not present

## 2021-01-16 DIAGNOSIS — E6609 Other obesity due to excess calories: Secondary | ICD-10-CM | POA: Diagnosis not present

## 2021-01-16 DIAGNOSIS — I10 Essential (primary) hypertension: Secondary | ICD-10-CM | POA: Diagnosis not present

## 2021-01-16 DIAGNOSIS — E785 Hyperlipidemia, unspecified: Secondary | ICD-10-CM | POA: Diagnosis not present

## 2021-01-16 DIAGNOSIS — E669 Obesity, unspecified: Secondary | ICD-10-CM | POA: Diagnosis not present

## 2021-04-12 DIAGNOSIS — E785 Hyperlipidemia, unspecified: Secondary | ICD-10-CM | POA: Diagnosis not present

## 2021-04-12 DIAGNOSIS — R7309 Other abnormal glucose: Secondary | ICD-10-CM | POA: Diagnosis not present

## 2021-04-12 DIAGNOSIS — I10 Essential (primary) hypertension: Secondary | ICD-10-CM | POA: Diagnosis not present

## 2021-05-21 DIAGNOSIS — R000 Tachycardia, unspecified: Secondary | ICD-10-CM | POA: Diagnosis not present

## 2021-05-21 DIAGNOSIS — E6609 Other obesity due to excess calories: Secondary | ICD-10-CM | POA: Diagnosis not present

## 2021-05-21 DIAGNOSIS — I10 Essential (primary) hypertension: Secondary | ICD-10-CM | POA: Diagnosis not present

## 2021-05-21 DIAGNOSIS — R7309 Other abnormal glucose: Secondary | ICD-10-CM | POA: Diagnosis not present

## 2021-05-21 DIAGNOSIS — E785 Hyperlipidemia, unspecified: Secondary | ICD-10-CM | POA: Diagnosis not present

## 2021-07-11 DIAGNOSIS — R000 Tachycardia, unspecified: Secondary | ICD-10-CM | POA: Diagnosis not present

## 2021-07-11 DIAGNOSIS — E785 Hyperlipidemia, unspecified: Secondary | ICD-10-CM | POA: Diagnosis not present

## 2021-07-11 DIAGNOSIS — I10 Essential (primary) hypertension: Secondary | ICD-10-CM | POA: Diagnosis not present

## 2021-07-11 DIAGNOSIS — E6609 Other obesity due to excess calories: Secondary | ICD-10-CM | POA: Diagnosis not present

## 2021-08-10 DIAGNOSIS — Z1231 Encounter for screening mammogram for malignant neoplasm of breast: Secondary | ICD-10-CM | POA: Diagnosis not present

## 2021-09-20 ENCOUNTER — Ambulatory Visit: Payer: Medicaid Other | Admitting: Cardiology

## 2021-10-15 ENCOUNTER — Ambulatory Visit: Payer: Medicaid Other | Admitting: Cardiology

## 2021-11-05 ENCOUNTER — Ambulatory Visit: Payer: Medicaid Other | Admitting: Cardiology

## 2022-05-12 ENCOUNTER — Telehealth: Payer: Self-pay | Admitting: General Practice

## 2022-05-12 NOTE — Telephone Encounter (Signed)
LVM for patient to call back to schedule apt with PCP. AS, CMA
